# Patient Record
Sex: Male | Born: 1960 | Race: White | Hispanic: No | Marital: Married | Smoking: Never smoker
Health system: Southern US, Community
[De-identification: ages and names within clinical notes are randomized; demographics above are authoritative.]

## PROBLEM LIST (undated history)

## (undated) DIAGNOSIS — E119 Type 2 diabetes mellitus without complications: Secondary | ICD-10-CM

## (undated) DIAGNOSIS — I10 Essential (primary) hypertension: Secondary | ICD-10-CM

## (undated) DIAGNOSIS — E7800 Pure hypercholesterolemia, unspecified: Secondary | ICD-10-CM

---

## 2001-10-07 ENCOUNTER — Emergency Department (HOSPITAL_COMMUNITY): Admission: EM | Admit: 2001-10-07 | Discharge: 2001-10-07 | Payer: Self-pay | Admitting: Emergency Medicine

## 2001-10-12 ENCOUNTER — Emergency Department (HOSPITAL_COMMUNITY): Admission: EM | Admit: 2001-10-12 | Discharge: 2001-10-13 | Payer: Self-pay | Admitting: Emergency Medicine

## 2001-10-13 ENCOUNTER — Encounter: Payer: Self-pay | Admitting: Emergency Medicine

## 2001-10-17 ENCOUNTER — Ambulatory Visit (HOSPITAL_COMMUNITY): Admission: RE | Admit: 2001-10-17 | Discharge: 2001-10-17 | Payer: Self-pay | Admitting: Family Medicine

## 2001-10-17 ENCOUNTER — Encounter: Payer: Self-pay | Admitting: Family Medicine

## 2001-11-14 ENCOUNTER — Ambulatory Visit (HOSPITAL_COMMUNITY): Admission: RE | Admit: 2001-11-14 | Discharge: 2001-11-14 | Payer: Self-pay | Admitting: Internal Medicine

## 2001-11-23 ENCOUNTER — Emergency Department (HOSPITAL_COMMUNITY): Admission: EM | Admit: 2001-11-23 | Discharge: 2001-11-23 | Payer: Self-pay | Admitting: Internal Medicine

## 2002-09-02 ENCOUNTER — Emergency Department (HOSPITAL_COMMUNITY): Admission: EM | Admit: 2002-09-02 | Discharge: 2002-09-02 | Payer: Self-pay | Admitting: Emergency Medicine

## 2007-07-02 ENCOUNTER — Emergency Department (HOSPITAL_COMMUNITY): Admission: EM | Admit: 2007-07-02 | Discharge: 2007-07-02 | Payer: Self-pay | Admitting: Emergency Medicine

## 2007-07-03 ENCOUNTER — Ambulatory Visit: Payer: Self-pay | Admitting: Orthopedic Surgery

## 2007-07-03 DIAGNOSIS — S61209A Unspecified open wound of unspecified finger without damage to nail, initial encounter: Secondary | ICD-10-CM | POA: Insufficient documentation

## 2007-07-04 ENCOUNTER — Encounter: Payer: Self-pay | Admitting: Orthopedic Surgery

## 2007-07-05 ENCOUNTER — Ambulatory Visit: Payer: Self-pay | Admitting: Orthopedic Surgery

## 2007-07-11 ENCOUNTER — Ambulatory Visit: Payer: Self-pay | Admitting: Orthopedic Surgery

## 2007-07-27 ENCOUNTER — Ambulatory Visit: Payer: Self-pay | Admitting: Orthopedic Surgery

## 2010-12-04 NOTE — H&P (Signed)
[facility]  Patient:    THANG, FLETT Visit Number: [id number] MRN: [id number]          Service Type: OUT Location: RAD Attending Physician:  Kirk Ruths Dictated by:   Tana Coast, P.A. Admit Date:  10/17/2001 Discharge Date: 10/17/2001   CC:         John Giovanni, M.D.   History and Physical  DATE OF BIRTH:  [birth year]  CHIEF COMPLAINT:  Sore throat, problem swallowing, acid reflux.  HISTORY OF PRESENT ILLNESS:  The patient is a pleasant 50 year old Caucasian male who we have been seeing at the request of Dr. Karleen Hampshire for evaluation of diarrhea.  He had been on clindamycin after developing a dry socket after removal of a wisdom tooth.  He was treated empirically with Flagyl 500 mg p.o. t.i.d., however, developed numbness and tingling in his hands and feet and was seen in the ER; medications were discontinued at that point.  He was started on Xanax for anxiety.  After being seen by Gardiner Coins, P.A., it was felt that he probably did have antibiotic-related diarrhea.  He was started on vancomycin 250 mg p.o. q.i.d. for seven days.  He called after taking three days worth stating that the pharmacy no longer has tablets and will need to have liquid vancomycin instead.  After taking a couple of doses of this, he complained of sore throat and dry mouth; subsequently, this medication was stopped.  Stool studies were obtained after being on Flagyl for five days.  C. difficile, O&P, stool culture and fecal WBC were all negative; he had a monospot which was negative; this was done primarily for elevated transaminases detected at the same time of this illness.  He has had hepatitis A antibody total, hepatitis B surface antibody and hepatitis C antibody which were all negative.  At last check on October 17, 2001, his SGOT was down to 26 and SGPT down to 66.  The patient presents today stating that his diarrhea has much improved.   He is having two soft bowel movements daily.  He continues to complain of abnormal bloating and gas which did respond to Gas-X previously.  He is still consuming a significant amount of yogurt as well.  Of note, after taking peppermint-flavored Gas-X, he developed a white coating on his tongue. Dr. John Giovanni began Mycelex lozenges for a suspected oral candidiasis. The patient complains of sore throat which has been persisting for at least a couple of weeks.  He complains of hoarseness intermittently over the last couple of years.  He has an eight- to nine-year history of acid reflux and regurgitation.  He recently has developed dysphagia primarily to vegetables over the last couple of weeks; he feels like food is getting stuck in his lower esophagus.  He complains of a very dry mouth and decreased saliva production.  He has had typical heartburn symptoms in his midesophagus region. He has been on PPI therapy for only a few weeks and has taken it intermittently.  He has never had an EGD done previously.  CURRENT MEDICATIONS: 1. Xanax p.r.n. 2. Mycelex lozenges t.i.d. 3. Nexium 40 mg q.d. p.r.n.  ALLERGIES:  PENICILLIN and CODEINE.  He reports throat swelling and throat pain with VANCOMYCIN LIQUID.  CLINDAMYCIN possibly caused C. difficile.  PAST MEDICAL HISTORY: 1. Recently treated for suspected C. difficile colitis, improved with    antibiotic therapy. 2. History of elevated transaminases recently discovered at time of this  illness.  His SGOT has normalized.  His SGPT is coming down nicely.    Hepatitis A, B surface and C antibodies were negative.  He has evidence of    fatty liver and pancreas on recent abdominal ultrasound.  FAMILY HISTORY:  No family history of colorectal cancer, chronic liver disease or GI malignancy.  SOCIAL HISTORY:  He has been married for three years.  He has two stepchildren.  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  He  does not smoke or use alcohol.  PHYSICAL EXAMINATION:  VITAL SIGNS:  Weight 273 pounds, down 2 pounds.  Height 5 foot 8-1/2 inches. Blood pressure 144/90.  Pulse 70.  GENERAL:  A pleasant, somewhat anxious-appearing Caucasian male in no acute distress.  SKIN:  Warm and dry.  No jaundice.  HEENT:  Conjunctivae are pink.  Sclerae nonicteric.  Oropharyngeal mucosa moist and pink.  No lesions, erythema or exudate.  NECK:  No lymphadenopathy or thyromegaly.  CHEST:  Lungs clear to auscultation.  CARDIAC:  Rhythm is regular rate and rhythm.  Normal S1 and S2.  No murmurs, rubs, or gallops.  ABDOMEN:  Positive bowel sounds.  Soft, obese, but symmetrical.  No masses or organomegaly.  He has mild tenderness in the epigastric region with deep palpation.  EXTREMITIES:  No cyanosis or edema.  IMPRESSION: 1. The patient is a pleasant 50 year old gentleman who was recently treated    for suspected Clostridium difficile colitis.  He responded with antibiotic    therapy and is doing reasonably well from that aspect at this time. 2. He has multiple complaints which I suspect are related to acid reflux.  He    complains of eight to nine years of acid reflux, just being on proton pump    inhibitor therapy for the last three weeks.  He has frequent acid    regurgitation, complains of intermittent hoarseness.  He has developed sore    throat for a couple of weeks recently as well as new-onset dysphagia.  He    denies any odynophagia, making Candida esophagitis less likely.  At any    rate, with constellation of symptoms and chronicity of his gastroesophageal    reflux disease symptoms, we need to further evaluate him via    esophagogastroduodenoscopy, specifically looking for any esophageal rings,    webs, strictures, Candida esophagitis, Barretts esophagus or even peptic    ulcer disease.  The patient is agreeable to proceed. 3. History of elevated liver function tests, I suspect related to  fatty liver.     He is due for a followup of liver function tests today.  PLAN: 1. EGD with plus-or-minus dilatation in the near future. 2. Recheck LFTs. 3. Will give a trial of antispasmodic, Bentyl 10 mg p.o. t.i.d., #90 with one    refill given. 4. He can continue Gas-X p.r.n., however, should avoid peppermint flavored. 5. Nexium 40 mg p.o. q.d. -- four weeks of samples given. 6. I discussed risks, alternatives and benefits with the patient regarding EGD    with dilatation and he is agreeable to proceed. Dictated by:   Tana Coast, P.A. Attending Physician:  Kirk Ruths DD:  11/07/01 TD:  11/07/01 Job: [id number]

## 2014-12-18 ENCOUNTER — Other Ambulatory Visit (HOSPITAL_COMMUNITY): Payer: Self-pay | Admitting: Family Medicine

## 2014-12-18 DIAGNOSIS — R1011 Right upper quadrant pain: Secondary | ICD-10-CM

## 2014-12-30 ENCOUNTER — Ambulatory Visit (HOSPITAL_COMMUNITY)
Admission: RE | Admit: 2014-12-30 | Discharge: 2014-12-30 | Disposition: A | Discharge status: Home / Self Care | Payer: BLUE CROSS/BLUE SHIELD | Source: Ambulatory Visit | Attending: Family Medicine | Admitting: Family Medicine

## 2014-12-30 DIAGNOSIS — R109 Unspecified abdominal pain: Secondary | ICD-10-CM | POA: Diagnosis not present

## 2014-12-30 DIAGNOSIS — R932 Abnormal findings on diagnostic imaging of liver and biliary tract: Secondary | ICD-10-CM | POA: Diagnosis not present

## 2014-12-30 DIAGNOSIS — R1011 Right upper quadrant pain: Secondary | ICD-10-CM

## 2015-01-09 ENCOUNTER — Ambulatory Visit (HOSPITAL_COMMUNITY): Admission: RE | Admit: 2015-01-09 | Payer: BLUE CROSS/BLUE SHIELD | Source: Ambulatory Visit

## 2015-01-09 ENCOUNTER — Ambulatory Visit (HOSPITAL_COMMUNITY)
Admission: RE | Admit: 2015-01-09 | Discharge: 2015-01-09 | Disposition: A | Discharge status: Home / Self Care | Payer: BLUE CROSS/BLUE SHIELD | Source: Ambulatory Visit | Attending: Family Medicine | Admitting: Family Medicine

## 2015-01-09 ENCOUNTER — Other Ambulatory Visit (HOSPITAL_COMMUNITY): Payer: Self-pay | Admitting: Family Medicine

## 2015-01-09 DIAGNOSIS — M79661 Pain in right lower leg: Principal | ICD-10-CM

## 2015-01-09 DIAGNOSIS — M25561 Pain in right knee: Secondary | ICD-10-CM

## 2015-01-10 ENCOUNTER — Other Ambulatory Visit (HOSPITAL_COMMUNITY): Payer: Self-pay | Admitting: Family Medicine

## 2015-01-10 ENCOUNTER — Ambulatory Visit (HOSPITAL_COMMUNITY)
Admission: RE | Admit: 2015-01-10 | Discharge: 2015-01-10 | Disposition: A | Discharge status: Home / Self Care | Payer: BLUE CROSS/BLUE SHIELD | Source: Ambulatory Visit | Attending: Family Medicine | Admitting: Family Medicine

## 2015-01-10 DIAGNOSIS — M79661 Pain in right lower leg: Secondary | ICD-10-CM | POA: Diagnosis not present

## 2015-01-10 DIAGNOSIS — M25561 Pain in right knee: Secondary | ICD-10-CM

## 2015-01-20 ENCOUNTER — Emergency Department (HOSPITAL_COMMUNITY): Payer: BLUE CROSS/BLUE SHIELD

## 2015-01-20 ENCOUNTER — Encounter (HOSPITAL_COMMUNITY): Payer: Self-pay | Admitting: *Deleted

## 2015-01-20 ENCOUNTER — Emergency Department (HOSPITAL_COMMUNITY)
Admission: EM | Admit: 2015-01-20 | Discharge: 2015-01-20 | Disposition: A | Discharge status: Home / Self Care | Payer: BLUE CROSS/BLUE SHIELD | Attending: Emergency Medicine | Admitting: Emergency Medicine

## 2015-01-20 DIAGNOSIS — Z880 Allergy status to penicillin: Secondary | ICD-10-CM | POA: Diagnosis not present

## 2015-01-20 DIAGNOSIS — R109 Unspecified abdominal pain: Secondary | ICD-10-CM | POA: Diagnosis present

## 2015-01-20 DIAGNOSIS — E119 Type 2 diabetes mellitus without complications: Secondary | ICD-10-CM | POA: Diagnosis not present

## 2015-01-20 DIAGNOSIS — Z794 Long term (current) use of insulin: Secondary | ICD-10-CM | POA: Insufficient documentation

## 2015-01-20 DIAGNOSIS — N23 Unspecified renal colic: Secondary | ICD-10-CM | POA: Diagnosis not present

## 2015-01-20 DIAGNOSIS — Z79899 Other long term (current) drug therapy: Secondary | ICD-10-CM | POA: Diagnosis not present

## 2015-01-20 DIAGNOSIS — I10 Essential (primary) hypertension: Secondary | ICD-10-CM | POA: Diagnosis not present

## 2015-01-20 DIAGNOSIS — E780 Pure hypercholesterolemia: Secondary | ICD-10-CM | POA: Insufficient documentation

## 2015-01-20 HISTORY — DX: Type 2 diabetes mellitus without complications: E11.9

## 2015-01-20 HISTORY — DX: Pure hypercholesterolemia, unspecified: E78.00

## 2015-01-20 HISTORY — DX: Essential (primary) hypertension: I10

## 2015-01-20 LAB — URINALYSIS, ROUTINE W REFLEX MICROSCOPIC
Bilirubin Urine: NEGATIVE
Glucose, UA: 500 mg/dL — AB
Hgb urine dipstick: NEGATIVE
KETONES UR: NEGATIVE mg/dL
LEUKOCYTES UA: NEGATIVE
NITRITE: NEGATIVE
PH: 6 (ref 5.0–8.0)
Protein, ur: NEGATIVE mg/dL
Specific Gravity, Urine: 1.02 (ref 1.005–1.030)
UROBILINOGEN UA: 0.2 mg/dL (ref 0.0–1.0)

## 2015-01-20 MED ORDER — KETOROLAC TROMETHAMINE 60 MG/2ML IM SOLN
30.0000 mg | Freq: Once | INTRAMUSCULAR | Status: AC
  Administered 2015-01-20: 30 mg via INTRAMUSCULAR
  Filled 2015-01-20: qty 2

## 2015-01-20 MED ORDER — OXYCODONE-ACETAMINOPHEN 5-325 MG PO TABS: ORAL_TABLET | ORAL | Status: DC

## 2015-01-20 MED ORDER — ONDANSETRON HCL 4 MG PO TABS
4.0000 mg | ORAL_TABLET | Freq: Three times a day (TID) | ORAL | Status: DC | PRN
Start: 2015-01-20 — End: 2015-05-26

## 2015-01-20 MED ORDER — MORPHINE SULFATE 4 MG/ML IJ SOLN
4.0000 mg | Freq: Once | INTRAMUSCULAR | Status: AC
  Administered 2015-01-20: 4 mg via INTRAMUSCULAR
  Filled 2015-01-20: qty 1

## 2015-01-20 MED ORDER — ONDANSETRON 8 MG PO TBDP
8.0000 mg | ORAL_TABLET | Freq: Once | ORAL | Status: AC
  Administered 2015-01-20: 8 mg via ORAL
  Filled 2015-01-20: qty 1

## 2015-01-20 NOTE — ED Notes (Signed)
Dr. Clarene DukeMcManus in with pt at this time

## 2015-01-20 NOTE — ED Provider Notes (Signed)
CSN: [id number]     Arrival date & time 01/20/15  2103 History   First MD Initiated Contact with Patient 01/20/15 2113     Chief Complaint  Patient presents with  . Flank Pain     HPI  Pt was seen at 2125. Per pt and his spouse, c/o gradual onset and persistence of constant left sided low back "pain" for the past several hours. Describes the pain as "dull." Pt took tylenol a few hours ago without improvement. Has been associated with nausea. Denies incont/retention of bowel or bladder, no saddle anesthesia, no focal motor weakness, no tingling/numbness in extremities, no fevers, no injury, no abd pain, no vomiting/diarrhea, no CP/SOB.    Past Medical History  Diagnosis Date  . Diabetes mellitus without complication   . Hypertension   . High cholesterol    History reviewed. No pertinent past surgical history.  History  Substance Use Topics  . Smoking status: Never Smoker   . Smokeless tobacco: Not on file  . Alcohol Use: No    Review of Systems ROS: Statement: All systems negative except as marked or noted in the HPI; Constitutional: Negative for fever and chills. ; ; Eyes: Negative for eye pain, redness and discharge. ; ; ENMT: Negative for ear pain, hoarseness, nasal congestion, sinus pressure and sore throat. ; ; Cardiovascular: Negative for chest pain, palpitations, diaphoresis, dyspnea and peripheral edema. ; ; Respiratory: Negative for cough, wheezing and stridor. ; ; Gastrointestinal: +nausea. Negative for vomiting, diarrhea, abdominal pain, blood in stool, hematemesis, jaundice and rectal bleeding. . ; ; Genitourinary: Negative for dysuria, flank pain and hematuria. ; ; Genital:  No penile drainage or rash, no testicular pain or swelling, no scrotal rash or swelling. ;; Musculoskeletal: +LBP. Negative for neck pain. Negative for swelling and trauma.; ; Skin: Negative for pruritus, rash, abrasions, blisters, bruising and skin lesion.; ; Neuro: Negative for headache, lightheadedness  and neck stiffness. Negative for weakness, altered level of consciousness , altered mental status, extremity weakness, paresthesias, involuntary movement, seizure and syncope.      Allergies  Penicillins  Home Medications   Prior to Admission medications   Medication Sig Start Date End Date Taking? Authorizing Provider  acetaminophen (TYLENOL) 500 MG tablet Take 1,000 mg by mouth every 6 (six) hours as needed for mild pain.   Yes Historical Provider, MD  calcium carbonate (TUMS - DOSED IN MG ELEMENTAL CALCIUM) 500 MG chewable tablet Chew 2 tablets by mouth 2 (two) times daily with a meal.   Yes Historical Provider, MD  gabapentin (NEURONTIN) 300 MG capsule Take 300 mg by mouth 3 (three) times daily.   Yes Historical Provider, MD  insulin glargine (LANTUS) 100 UNIT/ML injection Inject 80 Units into the skin at bedtime.   Yes Historical Provider, MD  LISINOPRIL PO Take 1 tablet by mouth daily.   Yes Historical Provider, MD  LOVASTATIN PO Take 1 tablet by mouth daily.   Yes Historical Provider, MD  meloxicam (MOBIC) 15 MG tablet Take 15 mg by mouth at bedtime as needed for pain.   Yes Historical Provider, MD  metFORMIN (GLUCOPHAGE) 500 MG tablet Take 500 mg by mouth 2 (two) times daily with a meal.   Yes Historical Provider, MD  traMADol (ULTRAM) 50 MG tablet Take 50 mg by mouth every 6 (six) hours as needed for moderate pain.   Yes Historical Provider, MD   BP 199/97 mmHg  Pulse 111  Temp(Src) 98.5 F (36.9 C) (Oral)  Resp 22  Ht 5\' 8"  (1.727 m)  Wt 266 lb (120.657 kg)  BMI 40.45 kg/m2  SpO2 100% Physical Exam  2130; Physical examination:  Nursing notes reviewed; Vital signs and O2 SAT reviewed;  Constitutional: Well developed, Well nourished, Well hydrated, Uncomfortable appearing; Head:  Normocephalic, atraumatic; Eyes: EOMI, PERRL, No scleral icterus; ENMT: Mouth and pharynx normal, Mucous membranes moist; Neck: Supple, Full range of motion, No lymphadenopathy; Cardiovascular:  Tachycardic rate and rhythm, No gallop; Respiratory: Breath sounds clear & equal bilaterally, No wheezes.  Speaking full sentences with ease, Normal respiratory effort/excursion; Chest: Nontender, Movement normal; Abdomen: Soft, Nontender, Nondistended, Normal bowel sounds; Genitourinary: No CVA tenderness; Spine:  No midline CS, TS, LS tenderness. +TTP left lumbar paraspinal muscles. No rash.;; Extremities: Pulses normal, No tenderness, No edema, No calf edema or asymmetry.; Neuro: AA&Ox3, Major CN grossly intact.  Speech clear. No gross focal motor or sensory deficits in extremities.; Skin: Color normal, Warm, Dry.   ED Course  Procedures     EKG Interpretation None      MDM  MDM Reviewed: previous chart, nursing note and vitals Reviewed previous: labs Interpretation: labs and CT scan      Results for orders placed or performed during the hospital encounter of 01/20/15  Urinalysis, Routine w reflex microscopic (not at [facility])  Result Value Ref Range   Color, Urine YELLOW YELLOW   APPearance CLEAR CLEAR   Specific Gravity, Urine 1.020 1.005 - 1.030   pH 6.0 5.0 - 8.0   Glucose, UA 500 (A) NEGATIVE mg/dL   Hgb urine dipstick NEGATIVE NEGATIVE   Bilirubin Urine NEGATIVE NEGATIVE   Ketones, ur NEGATIVE NEGATIVE mg/dL   Protein, ur NEGATIVE NEGATIVE mg/dL   Urobilinogen, UA 0.2 0.0 - 1.0 mg/dL   Nitrite NEGATIVE NEGATIVE   Leukocytes, UA NEGATIVE NEGATIVE   Ct Renal Stone Study 01/20/2015   CLINICAL DATA:  LEFT flank pain beginning yesterday. History of diabetes and hypertension, no history of kidney stones.  EXAM: CT ABDOMEN AND PELVIS WITHOUT CONTRAST  TECHNIQUE: Multidetector CT imaging of the abdomen and pelvis was performed following the standard protocol without IV contrast.  COMPARISON:  Abdominal ultrasound December 30, 2014  FINDINGS: LUNG BASES: 3 mm RIGHT middle lobe sub solid pulmonary nodule, below size followup recommendations. Included heart size is normal. No pericardial  effusions. Small paraesophageal lymph nodes.  KIDNEYS/BLADDER: Kidneys are orthotopic, demonstrating normal size and morphology. No nephrolithiasis ; limited assessment for renal masses on this nonenhanced examination. Very mild LEFT hydroureteronephrosis, mild LEFT perinephric fat stranding. Urinary bladder is partially distended and unremarkable.  SOLID ORGANS: The liver, spleen, pancreas and adrenal glands are unremarkable for this non-contrast examination. Subcentimeter gallstone without CT findings of acute cholecystitis.  GASTROINTESTINAL TRACT: The stomach, small and large bowel are normal in course and caliber without inflammatory changes, the sensitivity may be decreased by lack of enteric contrast. Mild amount of retained large bowel stool. Mild descending colon and sigmoid diverticulosis. Normal appendix.  PERITONEUM/RETROPERITONEUM: Aortoiliac vessels are normal in course and caliber. No lymphadenopathy by CT size criteria. Small cavoatrial an mesenteric lymph nodes are likely reactive. Prostate is not enlarged. No intraperitoneal free fluid nor free air.  SOFT TISSUES/ OSSEOUS STRUCTURES: Nonsuspicious. Linear RIGHT gluteal subcutaneous gas. Moderate degenerative change of the included thoracic spine. Scattered Schmorl's nodes. Mild degenerative change of the LEFT hip.  IMPRESSION: Very mild LEFT hydroureteronephrosis and perinephric fat stranding without urolithiasis likely representing recently passed stone, less likely urinary tract infection. Recommend correlation with urinary analysis.  Mild diverticulosis without acute diverticulitis.  RIGHT gluteal subcutaneous gas, recommend correlation with recent injection.   Electronically Signed   By: Awilda Metro M.D.   On: 01/20/2015 22:37    2315:  Pain improving after meds. No N/V while in the ED. No UTI on Udip. CT scan with likely recently passed left ureteral stone. Pt is ready to go home now. Dx and testing d/w pt and family.  Questions  answered.  Verb understanding, agreeable to d/c home with outpt f/u.   Samuel Jester, DO 01/22/15 1129

## 2015-01-20 NOTE — Discharge Instructions (Signed)
°Emergency Department Resource Guide °1) Find a Doctor and Pay Out of Pocket °Although you won't have to find out who is covered by your insurance plan, it is a good idea to ask around and get recommendations. You will then need to call the office and see if the doctor you have chosen will accept you as a new patient and what types of options they offer for patients who are self-pay. Some doctors offer discounts or will set up payment plans for their patients who do not have insurance, but you will need to ask so you aren't surprised when you get to your appointment. ° °2) Contact Your Local Health Department °Not all health departments have doctors that can see patients for sick visits, but many do, so it is worth a call to see if yours does. If you don't know where your local health department is, you can check in your phone book. The CDC also has a tool to help you locate your state's health department, and many state websites also have listings of all of their local health departments. ° °3) Find a Walk-in Clinic °If your illness is not likely to be very severe or complicated, you may want to try a walk in clinic. These are popping up all over the country in pharmacies, drugstores, and shopping centers. They're usually staffed by nurse practitioners or physician assistants that have been trained to treat common illnesses and complaints. They're usually fairly quick and inexpensive. However, if you have serious medical issues or chronic medical problems, these are probably not your best option. ° °No Primary Care Doctor: °- Call [facility] at  832-8000 - they can help you locate a primary care doctor that  accepts your insurance, provides certain services, etc. °- Physician Referral Service- 1-800-533-3463 ° °Chronic Pain Problems: °Organization         Address  Phone   Notes  °[facility]  (336) 297-2271 Patients need to be referred by their primary care doctor.  ° °Medication  Assistance: °Organization         Address  Phone   Notes  °[place] [facility] [address] °[place], [place] [address] (336) 641-8030 --Must be a resident of [place] °-- Must have NO insurance coverage whatsoever (no Medicaid/ Medicare, etc.) °-- The pt. MUST have a primary care doctor that directs their care regularly and follows them in the community °  °[facility]  (866) 331-1348   °[employer]  (888) 892-1162   ° °Agencies that provide inexpensive medical care: °Organization         Address  Phone   Notes  °[facility]  (336) 832-8035   °[facility]    (336) 832-7272   °[facility] [address] °[place], [place] [address] (336) 832-4777   °[facility] [address], °[place] (336) 271-4999   °[facility]    (336) 373-0678   °[facility]    (336) 272-1050   °[facility] ° [address], [place] Phone:  (336) 832-4444, Fax:  (336) 832-4440 Hours of Operation:  9 am - 6 pm, M-F.  Also accepts Medicaid/Medicare and self-pay.  °[facility] ° [address], [place] Phone: (336) 832-3150, Fax: (336) [phone]. Hours of Operation:  8:30 am - 5:30 pm, M-F.  Also accepts Medicaid and self-pay.  °[facility] [place] [address], [place] Phone: (336) 878-6027   °[facility] [address], [place], [place] (336)723-1848, Ext. 123 Mondays & Thursdays: 7-9 AM.  First 15 patients are seen on a first come, first serve basis. °  ° °Medicaid-accepting [place] Providers: ° °Organization         Address  Phone   Notes  °[facility] [address], [place] (336) 641-2100 Also accepts self-pay patients.  °[facility] [address], [place] ° (336) 856-9996   °[facility] [address], [place]  (336) 288-8857   °[facility] [address] [place] [address], [place] (336) 299-7000   °Veita Bland [address], [place]  ° (336) 373-1557 Only accepts [place] Access Medicaid patients after they have their name applied to their card.  ° °Self-Pay (no insurance) in [place]: ° °Organization         Address  Phone   Notes  °Sickle Cell Patients, [facility] [address], [place] (336) 832-1970   °[facility] [address], [place] (336) 832-4400   °[facility] ° [address], [place] (336) 992-4800   °[facility]/Dr. Osei-Bonsu ° [address] [place] [address], [place] or [address], [place] (336) 841-8500 Phone number for both [place] and [place] locations is the same.  °[facility] [address], [place] (336) 299-0000   °[facility] [address] [place] [address], [place] or [address] (336) 852-7530 °(336) 878-2260   °[facility] [address], [place] (336) 350-1642, phone; (336) 294-5005, fax Sees patients 1st and 3rd Saturday of every month.  Must not qualify for public or private insurance (i.e. Medicaid, Medicare, [place] Health Choice, Veterans' Benefits) • Household income should be no more than 200% of the poverty level •The clinic cannot treat you if you are pregnant or think you are pregnant • Sexually transmitted diseases are not treated at the clinic.  ° ° °Dental Care: °Organization         Address  Phone  Notes  °[place] [facility] [address], [place] (336) 641-6152 Accepts children up to age 21 who are enrolled in Medicaid or [place] Health Choice; pregnant women with a Medicaid card; and children who have applied for Medicaid or [place] Health Choice, but were declined, whose parents can pay a reduced fee at time of service.  °[place]  [facility] [place]  [address], [place] (336) 641-7733 Accepts children up to age 21 who are enrolled in Medicaid or [place] Health Choice; pregnant women with a Medicaid card; and children who have applied for Medicaid or [place] Health Choice, but were declined, whose parents can pay a reduced fee at time of service.  °[facility] ° [address], [place] (336) 641-4533 Patients are seen by appointment only. Walk-ins are not accepted. [facility] will see patients 18 years of age and older. °Monday - Tuesday (8am-5pm) °Most Wednesdays (8:30-5pm) °$30 per visit, cash only  °[facility] ° [address], [place] (336) 641-4533 Patients are seen by appointment only. Walk-ins are not accepted. [facility] will see patients 18 years of age and older. °One   Wednesday Evening (Monthly: Volunteer Based).  $30 per visit, cash only  °[facility]  (919) 537-3737 for adults; Children under age 4, call Graduate Pediatric Dentistry at ([phone]) [phone]. Children aged 4-14, please call (919) [phone] to request a pediatric application. ° Dental services are provided in all areas of dental care including fillings, crowns and bridges, complete and partial dentures, implants, gum treatment, root canals, and extractions. Preventive care is also provided. Treatment is provided to both adults and children. °Patients are selected via a lottery and there is often a waiting list. °  °[facility] [address], °[place] ° (336) 763-8833 [email or web address] °  °[facility] [address], [place], [place] (336)723-1848, Ext. 123 Second and Fourth Thursday of each month, opens at 6:30 AM; Clinic ends at 9 AM.  Patients are seen on a first-come first-served basis, and a limited number are seen during each clinic.  ° °[facility] ° [address], [place], [place] (336) 723-7904    Eligibility Requirements °You must have lived in [place], [place], or [place] counties for at least the last three months. °  You cannot be eligible for state or federal sponsored healthcare insurance, including Veterans Administration, Medicaid, or Medicare. °  You generally cannot be eligible for healthcare insurance through your employer.  °  How to apply: °Eligibility screenings are held every Tuesday and Wednesday afternoon from 1:00 pm until 4:00 pm. You do not need an appointment for the interview!  °[facility] [address], [place], [place] 336-631-2330   °[facility]  336-342-8273   °[facility]  336-703-3100   °[facility]  336-570-6415   ° °Behavioral Health Resources in the Community: °Intensive Outpatient Programs °Organization         Address  Phone  Notes  °[facility] [address], [place], [place] 336-878-6098   °[facility] [address], [place], [place] 336-832-9800   °[facility] [address], [place], [place] ° 336-882-2125   °[facility] [address],  °[place], [place] 1-800-853-5163 or 336-641-4981   °Substance Abuse Resources °Organization         Address  Phone  Notes  °[facility]  336-882-2125   °[facility]  336-784-9470   °[facility]  336-285-9073   °[facility]  336-845-3988   °Residential & Outpatient Substance Abuse Program  1-800-659-3381   °Psychological Services °Organization         Address  Phone  Notes  °[facility]  336- 832-9600   °[facility]  336- 378-7881   °[facility] [address], [place] 1-800-853-5163 or 336-641-4981   ° °Mobile Crisis Teams °Organization         Address  Phone  Notes  °[facility], Mobile Crisis Care Unit  1-877-626-1772   °Assertive °[facility] ° [address]  [place], [place] 336-834-9664   °Sharon DeEsch [address] °[place] 336-554-5454   ° °Self-Help/Support Groups °Organization         Address  Phone             Notes  °[facility]  - variety of support groups  336- 373-1402 Call for more information  °Narcotics Anonymous (NA), [facility] [address], °[place]  2 meetings at this location  ° °  Residential Treatment Programs Organization         Address  Phone  Notes  [facility] [address],    [place]  [phone]   [facility]  [address], [place] [phone], [place], [place] [phone]([phone]) [phone]   [facility] [address], [place] [phone] Admissions: 8am-3pm M-F  [facility] [address]    [place], [place] [phone]   [facility] [address], [place], [place] [phone]   [facility] [address]  [place], [place] [phone]   [facility] Intensive Outpatient [address], [place], [place] [phone]   [facility] [address]  [place], [place] [phone] or [phone]   [facility] [address] [place], [place] [phone] Accepts Medicaid  [facility] [address]  [place] [phone] Substance Abuse/Addiction Treatment   [facility] Organization         Address  Phone  Notes  [facility]  409-729-9669(888) [phone]   Angie FavaJulie Brannon, PhD [address] [place], [place]   [phone](336) [phone] or [phone](336) [phone]   [facility]   [address] [place], [place] [phone](336) [phone]   [facility] [address], [place], [place] [phone](336) [phone] Insurance/Medicaid/sponsorship through [facility] [address]                                    [place], [place] [phone](336) [phone] Therapy/tele-psych/case    [facility] [address]   [place], [place] [phone](336) [phone]    Dr. Lolly MustacheArfeen  [phone](336) [phone]   [facility]  [employer] [facility] 1) [address], [place] 2) [address], [place] 3)  [address], [place] [phone](336) [phone](336) [phone]  [phone](336) [phone]   [facility] Child Abuse Hotline 740-031-4920(336) [phone] or [phone](336) [phone] (After Hours)      Take the prescriptions as directed.  Call your regular medical doctor tomorrow to schedule a follow up appointment within the next 2 to 3 days.  Return to the Emergency Department immediately sooner if worsening.

## 2015-01-20 NOTE — ED Notes (Addendum)
Pt c/o dull pain to his left flank area. Pt also c/o nausea and dizziness. Pt states he just ate a big meal at a cookout and the pain got worse.

## 2015-05-26 ENCOUNTER — Encounter (HOSPITAL_COMMUNITY): Payer: Self-pay | Admitting: Emergency Medicine

## 2015-05-26 ENCOUNTER — Emergency Department (HOSPITAL_COMMUNITY)
Admission: EM | Admit: 2015-05-26 | Discharge: 2015-05-27 | Disposition: A | Discharge status: Home / Self Care | Payer: BLUE CROSS/BLUE SHIELD | Attending: Emergency Medicine | Admitting: Emergency Medicine

## 2015-05-26 DIAGNOSIS — I10 Essential (primary) hypertension: Secondary | ICD-10-CM | POA: Diagnosis present

## 2015-05-26 DIAGNOSIS — Z7982 Long term (current) use of aspirin: Secondary | ICD-10-CM | POA: Diagnosis not present

## 2015-05-26 DIAGNOSIS — Z794 Long term (current) use of insulin: Secondary | ICD-10-CM | POA: Insufficient documentation

## 2015-05-26 DIAGNOSIS — E119 Type 2 diabetes mellitus without complications: Secondary | ICD-10-CM | POA: Insufficient documentation

## 2015-05-26 DIAGNOSIS — Z880 Allergy status to penicillin: Secondary | ICD-10-CM | POA: Insufficient documentation

## 2015-05-26 DIAGNOSIS — R000 Tachycardia, unspecified: Secondary | ICD-10-CM | POA: Insufficient documentation

## 2015-05-26 DIAGNOSIS — Z79899 Other long term (current) drug therapy: Secondary | ICD-10-CM | POA: Insufficient documentation

## 2015-05-26 DIAGNOSIS — E7800 Pure hypercholesterolemia, unspecified: Secondary | ICD-10-CM | POA: Diagnosis not present

## 2015-05-26 LAB — BASIC METABOLIC PANEL
Anion gap: 9 (ref 5–15)
BUN: 19 mg/dL (ref 6–20)
CO2: 27 mmol/L (ref 22–32)
Calcium: 9.5 mg/dL (ref 8.9–10.3)
Chloride: 104 mmol/L (ref 101–111)
Creatinine, Ser: 1.14 mg/dL (ref 0.61–1.24)
GFR calc Af Amer: >60 mL/min (ref >60)
GLUCOSE: 195 mg/dL — AB (ref 65–99)
POTASSIUM: 4.4 mmol/L (ref 3.5–5.1)
Sodium: 140 mmol/L (ref 135–145)

## 2015-05-26 NOTE — ED Notes (Signed)
Patient states he went to his doctor today and his B/P was 180/110. Was told to take blood pressure medication when he got home. States he took his blood pressure medication and 0.25 xanax. States two hours later blood pressure was still elevated.

## 2015-05-26 NOTE — ED Provider Notes (Signed)
CSN: [id number]     Arrival date & time 05/26/15  2107 History   First MD Initiated Contact with Patient 05/26/15 2129     Chief Complaint  Patient presents with  . Hypertension     (Consider location/radiation/quality/duration/timing/severity/associated sxs/prior Treatment) The history is provided by the patient.   ROBERTS BON is a 54 y.o. male with history of DM and hypertension presenting for evaluation of elevated blood pressure today.  He was seen by his pcp today and had a bp of 180/110.  He was told to take an extra lisinopril (usually takes 20 mg qhs).  He took the first tablet when he arrived home with plans to take the second one before bed and also took xanax tablet, but when he took his bp 2 hours later, his bp was still as elevated.  He denies headache, vision changes, chest pain, sob or peripheral edema.  He has recently had nasal congestion and took ibuprofen all weekend along with alka seltzer Plus Cold Formula (although took the last dose of this 3 days ago).  He called his pcp who advised to get checked here tonight and he would followup in the morning to address his medications.    Past Medical History  Diagnosis Date  . Diabetes mellitus without complication (HCC)   . Hypertension   . High cholesterol    History reviewed. No pertinent past surgical history. History reviewed. No pertinent family history. Social History  Substance Use Topics  . Smoking status: Never Smoker   . Smokeless tobacco: None  . Alcohol Use: No    Review of Systems  Constitutional: Negative for fever.  HENT: Negative for congestion and sore throat.   Eyes: Negative.  Negative for visual disturbance.  Respiratory: Negative for chest tightness, shortness of breath and wheezing.   Cardiovascular: Negative for chest pain, palpitations and leg swelling.  Gastrointestinal: Negative for nausea and abdominal pain.  Genitourinary: Negative.   Musculoskeletal: Negative for joint swelling,  arthralgias and neck pain.  Skin: Negative.  Negative for rash and wound.  Neurological: Negative for dizziness, weakness, light-headedness, numbness and headaches.  Psychiatric/Behavioral: Negative.       Allergies  Penicillins  Home Medications   Prior to Admission medications   Medication Sig Start Date End Date Taking? Authorizing Provider  acetaminophen (TYLENOL) 500 MG tablet Take 1,000-1,500 mg by mouth 2 (two) times daily as needed for mild pain or moderate pain.    Yes Historical Provider, MD  ALPRAZolam Prudy Feeler) 0.25 MG tablet Take 1 tablet by mouth 3 (three) times daily as needed for anxiety or sleep.  03/27/15  Yes Historical Provider, MD  aspirin EC 81 MG tablet Take 81 mg by mouth daily.   Yes Historical Provider, MD  calcium carbonate (TUMS - DOSED IN MG ELEMENTAL CALCIUM) 500 MG chewable tablet Chew 2 tablets by mouth 2 (two) times daily with a meal.   Yes Historical Provider, MD  docusate sodium (COLACE) 100 MG capsule Take 100 mg by mouth daily as needed for mild constipation.   Yes Historical Provider, MD  gabapentin (NEURONTIN) 300 MG capsule Take 300 mg by mouth daily as needed (for pain).    Yes Historical Provider, MD  LANTUS SOLOSTAR 100 UNIT/ML Solostar Pen Take 70 Units by mouth every morning. 05/22/15  Yes Historical Provider, MD  lisinopril (PRINIVIL,ZESTRIL) 20 MG tablet Take 20 mg by mouth daily. 05/22/15  Yes Historical Provider, MD  lovastatin (MEVACOR) 40 MG tablet Take 40 mg by mouth daily.  05/22/15  Yes Historical Provider, MD  metFORMIN (GLUCOPHAGE-XR) 500 MG 24 hr tablet Take 500 mg by mouth 2 (two) times daily. 05/07/15  Yes Historical Provider, MD  Probiotic Product (ACIDOPHILUS PEARLS) CAPS Take 1 capsule by mouth daily.   Yes Historical Provider, MD  traMADol (ULTRAM) 50 MG tablet Take 50 mg by mouth every 6 (six) hours as needed for moderate pain.   Yes Historical Provider, MD   BP 166/93 mmHg  Pulse 113  Temp(Src) 98 F (36.7 C) (Oral)  Resp 12   Ht 5' 8.5" (1.74 m)  Wt 260 lb (117.935 kg)  BMI 38.95 kg/m2  SpO2 99% Physical Exam  Constitutional: He appears well-developed and well-nourished.  HENT:  Head: Normocephalic and atraumatic.  Eyes: Conjunctivae are normal.  Neck: Normal range of motion.  Cardiovascular: Normal rate, regular rhythm, normal heart sounds and intact distal pulses.   Pulmonary/Chest: Effort normal and breath sounds normal. He has no wheezes.  Abdominal: Soft. Bowel sounds are normal. There is no tenderness.  Musculoskeletal: Normal range of motion. He exhibits no edema or tenderness.  Neurological: He is alert.  Skin: Skin is warm and dry.  Psychiatric: He has a normal mood and affect.  Nursing note and vitals reviewed.   ED Course  Procedures (including critical care time) Labs Review Labs Reviewed  BASIC METABOLIC PANEL - Abnormal; Notable for the following:    Glucose, Bld 195 (*)    All other components within normal limits    Imaging Review No results found. I have personally reviewed and evaluated these images and lab results as part of my medical decision-making.   EKG Interpretation   Date/Time:  Monday May 26 2015 21:30:46 EST Ventricular Rate:  111 PR Interval:  138 QRS Duration: 89 QT Interval:  323 QTC Calculation: 439 R Axis:   -6 Text Interpretation:  Sinus tachycardia No previous ECGs available  Confirmed by ZACKOWSKI  MD, SCOTT [phone]([id number]) on 05/26/2015 10:04:15 PM      MDM   Final diagnoses:  Essential hypertension  Tachycardia    Prior visit from July reviewed, tachycardic during that visit as well.  No cp, no sob, normal saturations, no clinical findings or sx to suggest PE.  Pt blames tachycardia on anxiety of being in the ed.  Recheck of bp 166/93.  Pt states this is much closer to his normal bp's.  No sx at time of dc.  Advised f/u with his pcp tomorrow which is already planned.  Pt also advised to hold ibuprofen and Alka Seltzer cold formula as these will  elevate bp.  Coricidin products recommended.    Burgess AmorJulie Laurali Goddard, PA-C 05/26/15 2312  Vanetta MuldersScott Zackowski, MD 05/26/15 [phone]

## 2015-05-26 NOTE — Discharge Instructions (Signed)
Hypertension Hypertension, commonly called high blood pressure, is when the force of blood pumping through your arteries is too strong. Your arteries are the blood vessels that carry blood from your heart throughout your body. A blood pressure reading consists of a higher number over a lower number, such as 110/72. The higher number (systolic) is the pressure inside your arteries when your heart pumps. The lower number (diastolic) is the pressure inside your arteries when your heart relaxes. Ideally you want your blood pressure below 120/80. Hypertension forces your heart to work harder to pump blood. Your arteries may become narrow or stiff. Having untreated or uncontrolled hypertension can cause heart attack, stroke, kidney disease, and other problems. RISK FACTORS Some risk factors for high blood pressure are controllable. Others are not.  Risk factors you cannot control include:   Race. You may be at higher risk if you are African American.  Age. Risk increases with age.  Gender. Men are at higher risk than women before age 45 years. After age 65, women are at higher risk than men. Risk factors you can control include:  Not getting enough exercise or physical activity.  Being overweight.  Getting too much fat, sugar, calories, or salt in your diet.  Drinking too much alcohol. SIGNS AND SYMPTOMS Hypertension does not usually cause signs or symptoms. Extremely high blood pressure (hypertensive crisis) may cause headache, anxiety, shortness of breath, and nosebleed. DIAGNOSIS To check if you have hypertension, your health care provider will measure your blood pressure while you are seated, with your arm held at the level of your heart. It should be measured at least twice using the same arm. Certain conditions can cause a difference in blood pressure between your right and left arms. A blood pressure reading that is higher than normal on one occasion does not mean that you need treatment. If  it is not clear whether you have high blood pressure, you may be asked to return on a different day to have your blood pressure checked again. Or, you may be asked to monitor your blood pressure at home for 1 or more weeks. TREATMENT Treating high blood pressure includes making lifestyle changes and possibly taking medicine. Living a healthy lifestyle can help lower high blood pressure. You may need to change some of your habits. Lifestyle changes may include:  Following the DASH diet. This diet is high in fruits, vegetables, and whole grains. It is low in salt, red meat, and added sugars.  Keep your sodium intake below 2,300 mg per day.  Getting at least 30-45 minutes of aerobic exercise at least 4 times per week.  Losing weight if necessary.  Not smoking.  Limiting alcoholic beverages.  Learning ways to reduce stress. Your health care provider may prescribe medicine if lifestyle changes are not enough to get your blood pressure under control, and if one of the following is true:  You are 18-59 years of age and your systolic blood pressure is above 140.  You are 60 years of age or older, and your systolic blood pressure is above 150.  Your diastolic blood pressure is above 90.  You have diabetes, and your systolic blood pressure is over 140 or your diastolic blood pressure is over 90.  You have kidney disease and your blood pressure is above 140/90.  You have heart disease and your blood pressure is above 140/90. Your personal target blood pressure may vary depending on your medical conditions, your age, and other factors. HOME CARE INSTRUCTIONS    Have your blood pressure rechecked as directed by your health care provider.   Take medicines only as directed by your health care provider. Follow the directions carefully. Blood pressure medicines must be taken as prescribed. The medicine does not work as well when you skip doses. Skipping doses also puts you at risk for  problems.  Do not smoke.   Monitor your blood pressure at home as directed by your health care provider. SEEK MEDICAL CARE IF:   You think you are having a reaction to medicines taken.  You have recurrent headaches or feel dizzy.  You have swelling in your ankles.  You have trouble with your vision. SEEK IMMEDIATE MEDICAL CARE IF:  You develop a severe headache or confusion.  You have unusual weakness, numbness, or feel faint.  You have severe chest or abdominal pain.  You vomit repeatedly.  You have trouble breathing. MAKE SURE YOU:   Understand these instructions.  Will watch your condition.  Will get help right away if you are not doing well or get worse.   This information is not intended to replace advice given to you by your health care provider. Make sure you discuss any questions you have with your health care provider.   Document Released: 07/05/2005 Document Revised: 11/19/2014 Document Reviewed: 04/27/2013 Elsevier Interactive Patient Education 2016 [employer]   Avoid taking any more ibuprofen and the alka seltzer cold medicine which may be driving your blood pressure higher.  I suggest Coricidin Brand products when needed for cold and congestion symptoms as these will not affect your blood pressure. Your lab tests tonight are ok.

## 2015-07-10 ENCOUNTER — Encounter (HOSPITAL_COMMUNITY): Payer: Self-pay | Admitting: *Deleted

## 2015-07-10 ENCOUNTER — Emergency Department (HOSPITAL_COMMUNITY)
Admission: EM | Admit: 2015-07-10 | Discharge: 2015-07-10 | Disposition: A | Discharge status: Home / Self Care | Payer: BLUE CROSS/BLUE SHIELD | Attending: Emergency Medicine | Admitting: Emergency Medicine

## 2015-07-10 DIAGNOSIS — Z794 Long term (current) use of insulin: Secondary | ICD-10-CM | POA: Insufficient documentation

## 2015-07-10 DIAGNOSIS — I10 Essential (primary) hypertension: Secondary | ICD-10-CM | POA: Insufficient documentation

## 2015-07-10 DIAGNOSIS — E7800 Pure hypercholesterolemia, unspecified: Secondary | ICD-10-CM | POA: Diagnosis not present

## 2015-07-10 DIAGNOSIS — Z880 Allergy status to penicillin: Secondary | ICD-10-CM | POA: Diagnosis not present

## 2015-07-10 DIAGNOSIS — Z7984 Long term (current) use of oral hypoglycemic drugs: Secondary | ICD-10-CM | POA: Insufficient documentation

## 2015-07-10 DIAGNOSIS — E119 Type 2 diabetes mellitus without complications: Secondary | ICD-10-CM | POA: Insufficient documentation

## 2015-07-10 DIAGNOSIS — Z79899 Other long term (current) drug therapy: Secondary | ICD-10-CM | POA: Insufficient documentation

## 2015-07-10 DIAGNOSIS — R0981 Nasal congestion: Secondary | ICD-10-CM | POA: Insufficient documentation

## 2015-07-10 DIAGNOSIS — R0989 Other specified symptoms and signs involving the circulatory and respiratory systems: Secondary | ICD-10-CM | POA: Insufficient documentation

## 2015-07-10 DIAGNOSIS — R0982 Postnasal drip: Secondary | ICD-10-CM | POA: Diagnosis not present

## 2015-07-10 DIAGNOSIS — R1310 Dysphagia, unspecified: Secondary | ICD-10-CM | POA: Insufficient documentation

## 2015-07-10 DIAGNOSIS — Z7982 Long term (current) use of aspirin: Secondary | ICD-10-CM | POA: Diagnosis not present

## 2015-07-10 NOTE — ED Notes (Addendum)
Pt states he got off work this morning and took his daily meds. States that he felt like a pill may have gotten lodged in his throat. States he was able to eat cereal and drink liquids afterwards. States he then had coffee and then vomited x 1. States he feels like he is panicking and feels like his throat may be closing up some. NAD. Pt is able to speak complete sentences with no distress. Pt states he is also getting over a recent cold and states dryness to throat.

## 2015-07-10 NOTE — Discharge Instructions (Signed)
Your vital signs are well within normal limits. Your oxygen level is 99%. Your airway is open at this time. Please return to the emergency department immediately if any difficulty with swallowing, difficulty with breathing, or changes in your general condition.

## 2015-07-10 NOTE — ED Provider Notes (Signed)
CSN: [id number]     Arrival date & time 07/10/15  9629 History   First MD Initiated Contact with Patient 07/10/15 1028     Chief Complaint  Patient presents with  . Foreign Body     (Consider location/radiation/quality/duration/timing/severity/associated sxs/prior Treatment) HPI Comments: Patient is a 54 year old male who presents to the emergency department with a sensation of foreign body in the throat.  The patient states that after having worked for an extended period of time he finished a night shift, came home this morning, took his daily medications. He felt as though that a pill was lodged in the back of his throat. He ate cereal and drank liquids, which he was able to get to go down without any major problem. He still felt the sensation, and drank some coffee to again see if he could get this foreign body sensation to go away. Not long after taking the coffee, he states he vomited 1. He then began to panic, and feel as though that his throat might be closing up. He went to his primary physician, and was told to come to the emergency department for additional evaluation. While sitting in the waiting area, he states that he felt as though the sensation was leaving, he felt that he couldn't breathe and swallow better without problem. He denies any swelling on any other parts of his body, no rash, no chest pain. It is of note that the patient is getting over a cold, and states he's had a lot of congestion and some irritation of his throat.  Patient is a 54 y.o. male presenting with foreign body. The history is provided by the patient.  Foreign Body Location:  Swallowed Associated symptoms: congestion and trouble swallowing   Associated symptoms: no choking     Past Medical History  Diagnosis Date  . Diabetes mellitus without complication (HCC)   . Hypertension   . High cholesterol    History reviewed. No pertinent past surgical history. No family history on file. Social History   Substance Use Topics  . Smoking status: Never Smoker   . Smokeless tobacco: None  . Alcohol Use: No    Review of Systems  HENT: Positive for congestion, postnasal drip and trouble swallowing.   Respiratory: Negative for choking, shortness of breath, wheezing and stridor.   Cardiovascular: Negative for chest pain.  All other systems reviewed and are negative.     Allergies  Penicillins  Home Medications   Prior to Admission medications   Medication Sig Start Date End Date Taking? Authorizing Provider  acetaminophen (TYLENOL) 500 MG tablet Take 1,000-1,500 mg by mouth 2 (two) times daily as needed for mild pain or moderate pain.     Historical Provider, MD  ALPRAZolam Prudy Feeler) 0.25 MG tablet Take 1 tablet by mouth 3 (three) times daily as needed for anxiety or sleep.  03/27/15   Historical Provider, MD  aspirin EC 81 MG tablet Take 81 mg by mouth daily.    Historical Provider, MD  calcium carbonate (TUMS - DOSED IN MG ELEMENTAL CALCIUM) 500 MG chewable tablet Chew 2 tablets by mouth 2 (two) times daily with a meal.    Historical Provider, MD  docusate sodium (COLACE) 100 MG capsule Take 100 mg by mouth daily as needed for mild constipation.    Historical Provider, MD  gabapentin (NEURONTIN) 300 MG capsule Take 300 mg by mouth daily as needed (for pain).     Historical Provider, MD  LANTUS SOLOSTAR 100 UNIT/ML Solostar Pen  Take 70 Units by mouth every morning. 05/22/15   Historical Provider, MD  lisinopril (PRINIVIL,ZESTRIL) 20 MG tablet Take 20 mg by mouth daily. 05/22/15   Historical Provider, MD  lovastatin (MEVACOR) 40 MG tablet Take 40 mg by mouth daily. 05/22/15   Historical Provider, MD  metFORMIN (GLUCOPHAGE-XR) 500 MG 24 hr tablet Take 500 mg by mouth 2 (two) times daily. 05/07/15   Historical Provider, MD  Probiotic Product (ACIDOPHILUS PEARLS) CAPS Take 1 capsule by mouth daily.    Historical Provider, MD  traMADol (ULTRAM) 50 MG tablet Take 50 mg by mouth every 6 (six) hours as  needed for moderate pain.    Historical Provider, MD   BP 163/91 mmHg  Pulse 115  Temp(Src) 98.1 F (36.7 C) (Oral)  Resp 18  Ht 5\' 8"  (1.727 m)  Wt 117.935 kg  BMI 39.54 kg/m2  SpO2 99% Physical Exam  Constitutional: He is oriented to person, place, and time. He appears well-developed and well-nourished.  Non-toxic appearance.  HENT:  Head: Normocephalic.  Right Ear: Tympanic membrane and external ear normal.  Left Ear: Tympanic membrane and external ear normal.  There is no edema of the tongue. The uvula is in the midline. There is minimal redness of the posterior pharynx. The airway is patent. Speech is clear.  Eyes: EOM and lids are normal. Pupils are equal, round, and reactive to light.  Neck: Normal range of motion. Neck supple. Carotid bruit is not present.  The trachea is in the midline. There is no palpable lymphadenopathy. Is no stridor appreciated.  Cardiovascular: Normal rate, regular rhythm, normal heart sounds, intact distal pulses and normal pulses.   Pulmonary/Chest: Breath sounds normal. No respiratory distress.  Patient speaks in complete sentences without problem. There is symmetrical rise and fall of the chest without problem.  Abdominal: Soft. Bowel sounds are normal. There is no tenderness. There is no guarding.  Musculoskeletal: Normal range of motion.  Lymphadenopathy:       Head (right side): No submandibular adenopathy present.       Head (left side): No submandibular adenopathy present.    He has no cervical adenopathy.  Neurological: He is alert and oriented to person, place, and time. He has normal strength. No cranial nerve deficit or sensory deficit.  Skin: Skin is warm and dry.  Psychiatric: He has a normal mood and affect. His speech is normal.  Nursing note and vitals reviewed.   ED Course  Procedures (including critical care time) Labs Review Labs Reviewed - No data to display  Imaging Review No results found. I have personally reviewed  and evaluated these images and lab results as part of my medical decision-making.   EKG Interpretation None      MDM  Vital signs reviewed. The pulse oximetry is 99% on room air. The patient speaks in complete sentences without problem. The patient states that he feels as though the foreign body sensation in his left throat has improved. He feels as though he can breathe and swallow without problem. The patient was reassured that his airway was patent, his oxygen level was well within normal limits. Discussed with the patient the need to return immediately if any changes, problems, or concerns with regard to his swallowing, breathing, or his throat. The patient is in agreement with the discharge plan.    Final diagnoses:  None    **I have reviewed nursing notes, vital signs, and all appropriate lab and imaging results for this patient.*  Ivery Quale, PA-C 07/10/15 1125  Loren Racer, MD 07/12/15 [phone]

## 2015-09-18 ENCOUNTER — Ambulatory Visit (HOSPITAL_COMMUNITY)
Admission: RE | Admit: 2015-09-18 | Discharge: 2015-09-18 | Disposition: A | Discharge status: Home / Self Care | Payer: BLUE CROSS/BLUE SHIELD | Source: Ambulatory Visit | Attending: Internal Medicine | Admitting: Internal Medicine

## 2015-09-18 ENCOUNTER — Other Ambulatory Visit (HOSPITAL_COMMUNITY): Payer: Self-pay | Admitting: Internal Medicine

## 2015-09-18 DIAGNOSIS — R05 Cough: Secondary | ICD-10-CM

## 2015-09-18 DIAGNOSIS — R059 Cough, unspecified: Secondary | ICD-10-CM

## 2015-09-18 DIAGNOSIS — Z1389 Encounter for screening for other disorder: Secondary | ICD-10-CM | POA: Insufficient documentation

## 2017-08-09 IMAGING — DX DG CHEST 2V
2 series · 2 of 2 positions shown · non-contrast
Comparison: None.

CLINICAL DATA: Left-sided chest pain for 3 weeks.

EXAM:
CHEST  2 VIEW

[chest pa]
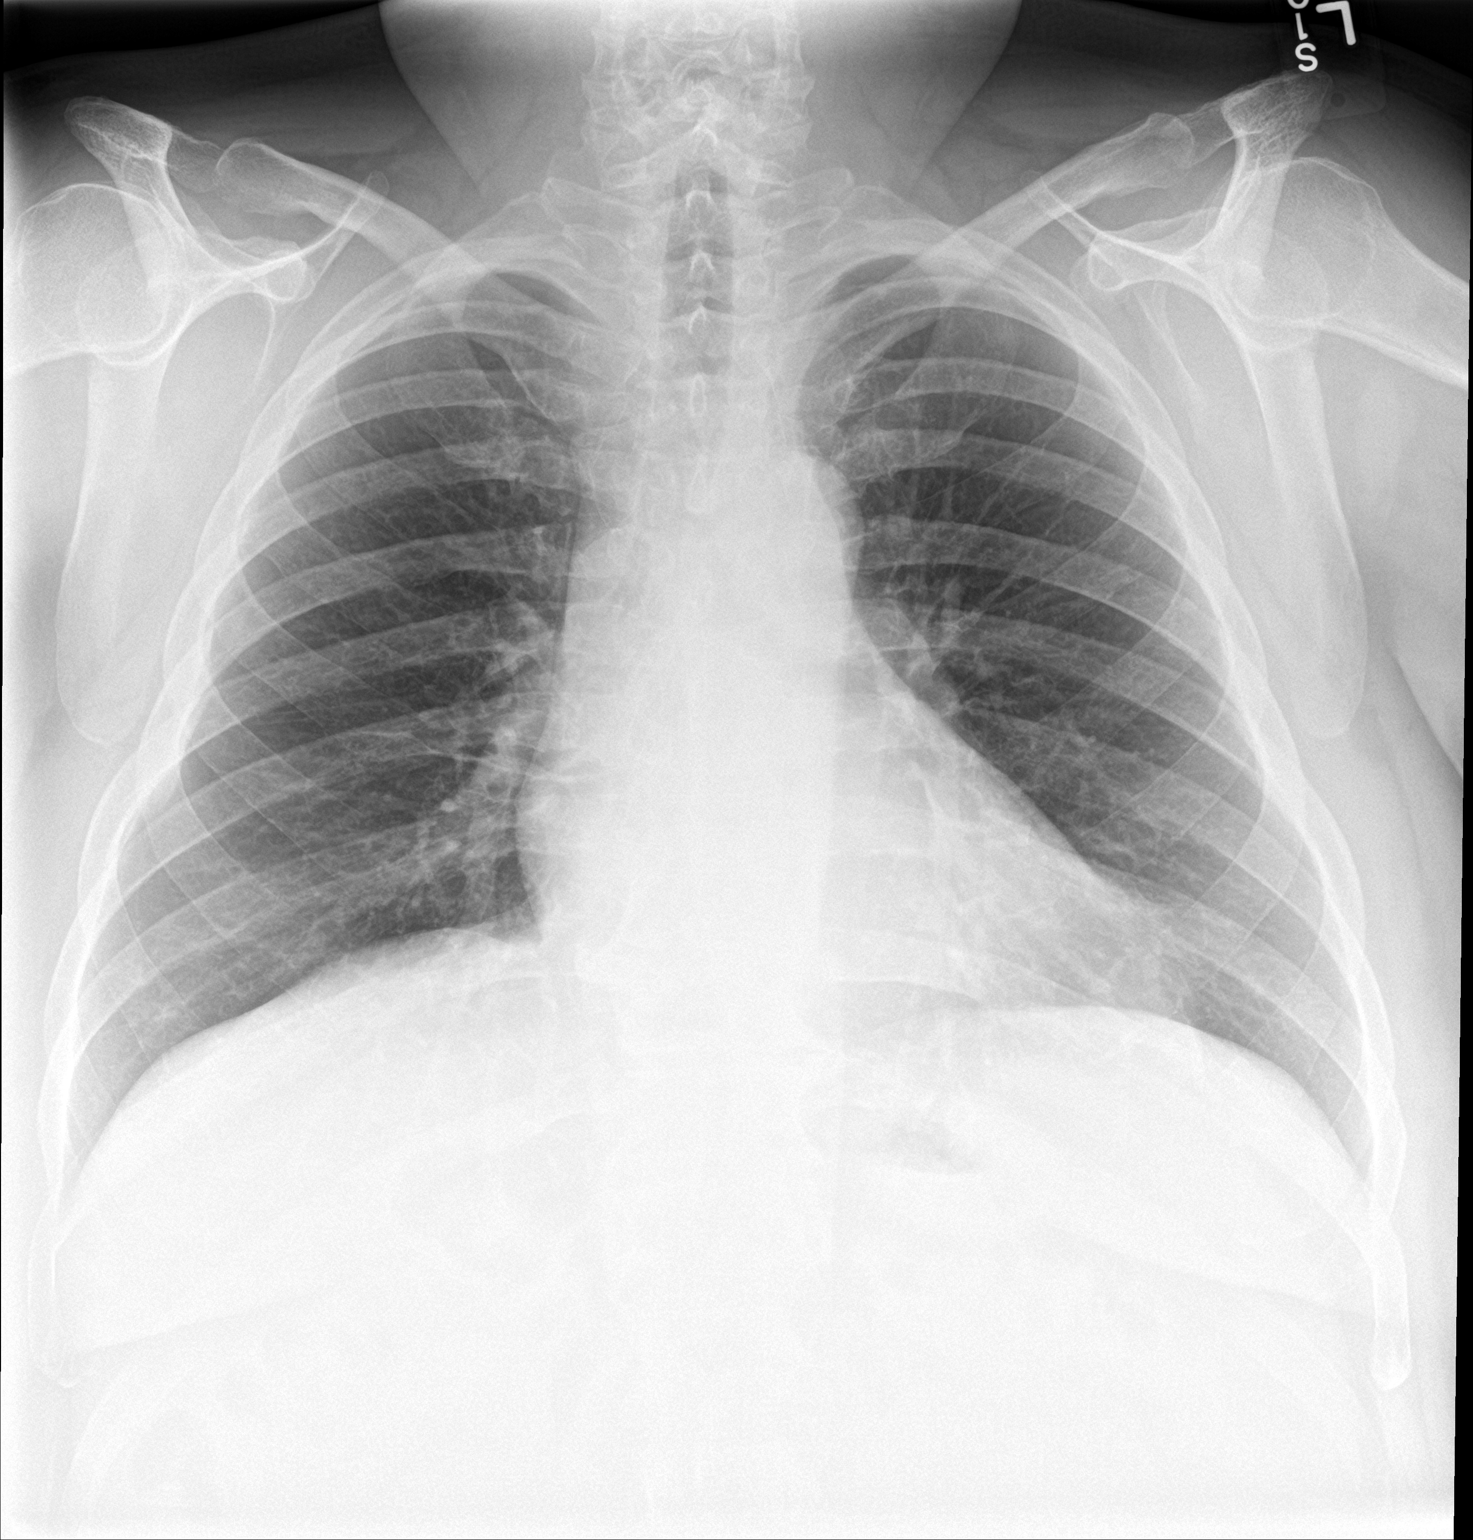

[chest lat]
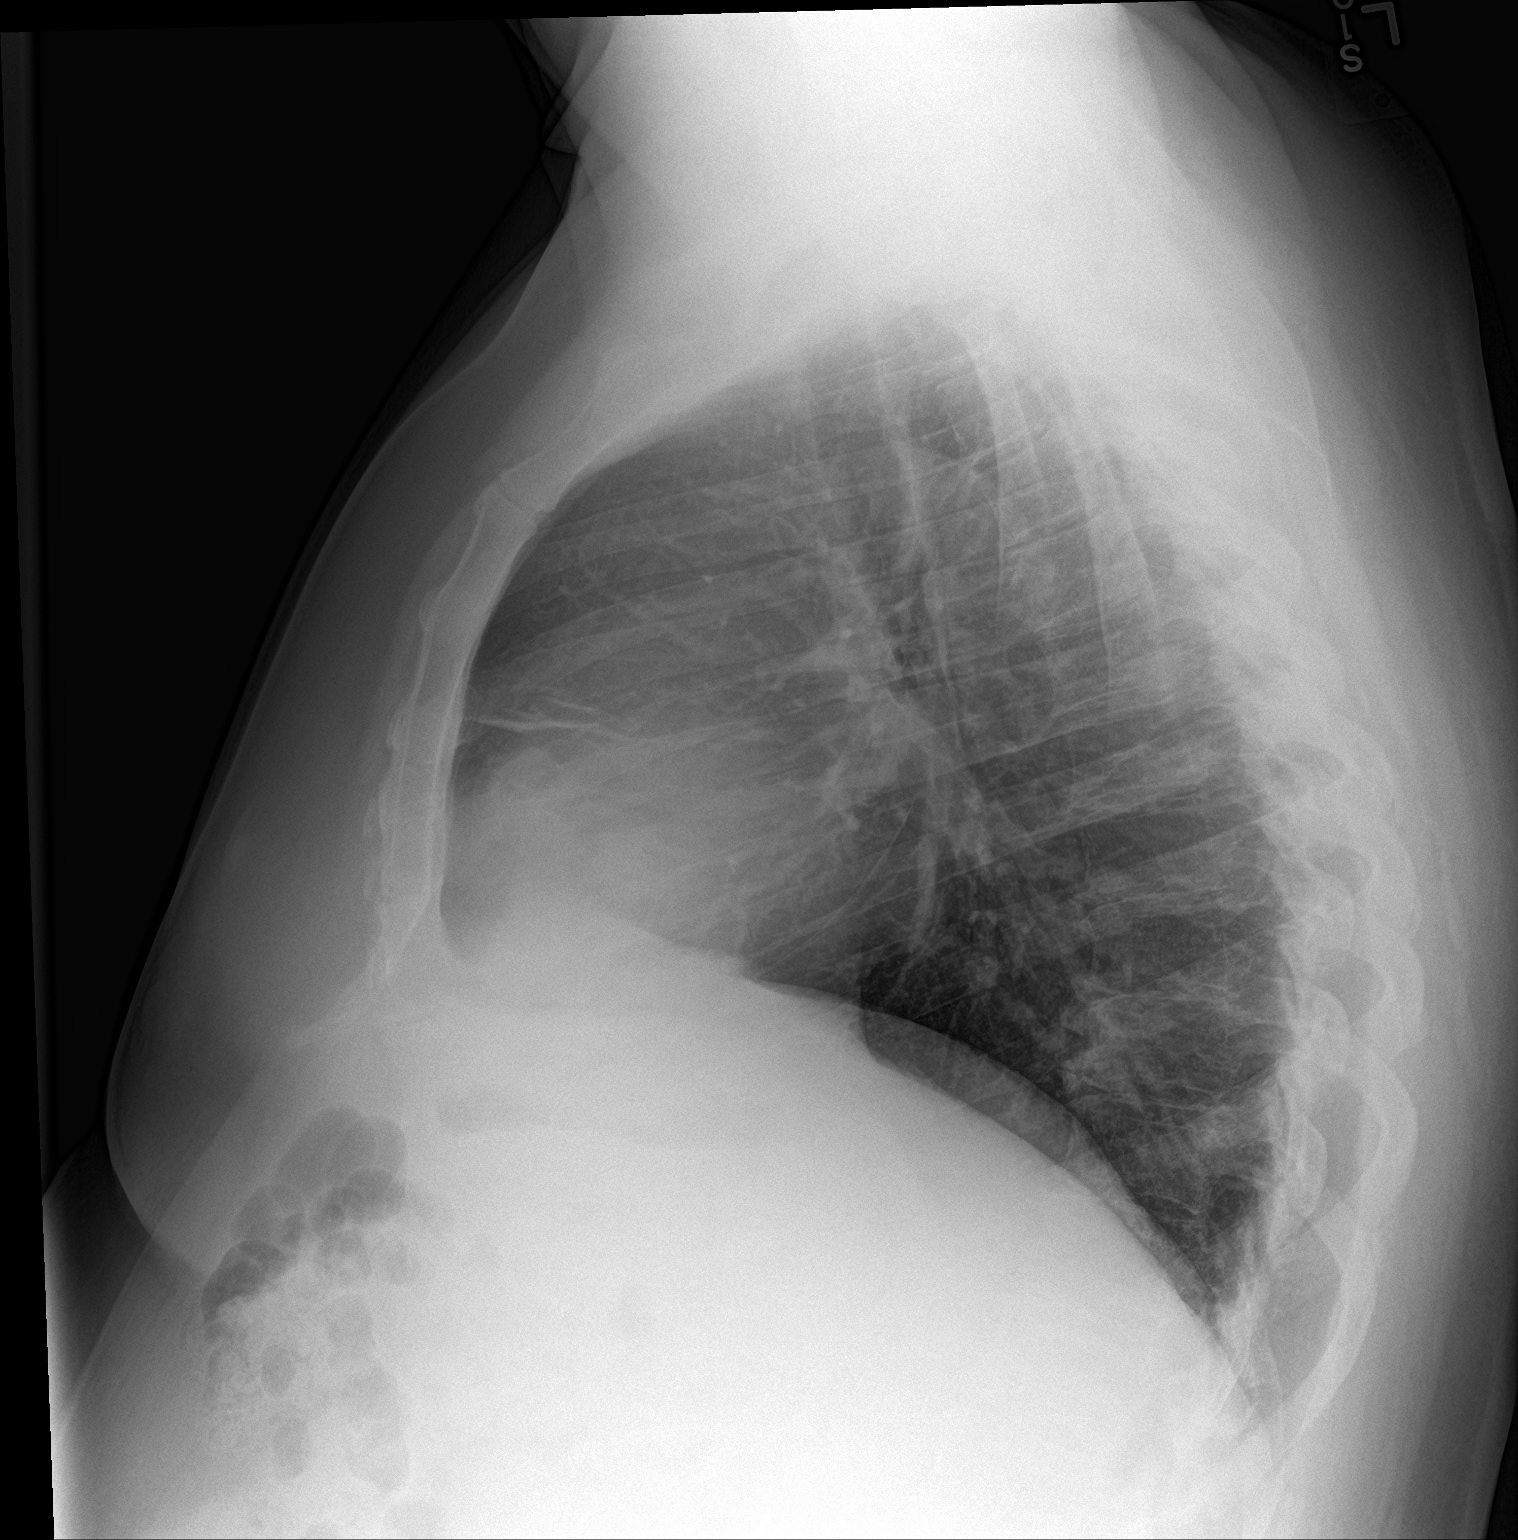

[2 of 2 positions shown; findings below may reference images not displayed]

FINDINGS: The heart size and mediastinal contours are within normal limits.
Both lungs are clear. No pneumothorax or pleural effusion is noted.
The visualized skeletal structures are unremarkable.
IMPRESSION: No active cardiopulmonary disease.

## 2021-06-03 ENCOUNTER — Encounter (INDEPENDENT_AMBULATORY_CARE_PROVIDER_SITE_OTHER): Payer: Self-pay | Admitting: *Deleted

## 2021-08-29 ENCOUNTER — Other Ambulatory Visit: Payer: Self-pay

## 2021-08-29 ENCOUNTER — Ambulatory Visit
Admission: EM | Admit: 2021-08-29 | Discharge: 2021-08-29 | Disposition: A | Discharge status: Home / Self Care | Payer: Commercial Managed Care - PPO

## 2021-08-29 ENCOUNTER — Ambulatory Visit: Payer: Self-pay

## 2021-08-29 DIAGNOSIS — R300 Dysuria: Secondary | ICD-10-CM | POA: Diagnosis not present

## 2021-08-29 LAB — POCT URINALYSIS DIP (MANUAL ENTRY)
Bilirubin, UA: NEGATIVE
Blood, UA: NEGATIVE
Glucose, UA: NEGATIVE mg/dL
Ketones, POC UA: NEGATIVE mg/dL
Leukocytes, UA: NEGATIVE
Nitrite, UA: NEGATIVE
Protein Ur, POC: NEGATIVE mg/dL
Spec Grav, UA: 1.02 (ref 1.010–1.025)
Urobilinogen, UA: 0.2 E.U./dL
pH, UA: 5.5 (ref 5.0–8.0)

## 2021-08-29 MED ORDER — CIPROFLOXACIN HCL 500 MG PO TABS: 500.0000 mg | ORAL_TABLET | Freq: Two times a day (BID) | ORAL | 0 refills | Status: DC

## 2021-08-29 NOTE — ED Provider Notes (Signed)
[facility]    CSN: [id number] Arrival date & time: 08/29/21  1143      History   Chief Complaint Chief Complaint  Patient presents with   UTI symptoms    HPI Andre White is a 61 y.o. male.   Presenting today with 3-day history of dysuria, what he believes to be prostate tenderness that is worse when he sits down.  He denies fever, chills, abdominal pain, nausea vomiting, hematuria, penile discharge or rashes, concern for STIs.  Has been trying to drink more water with minimal relief.  Has had a prostate infection in the past that felt very similar to this.  Known history of enlarged prostate.   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 No pertinent surgical history.     Home Medications    Prior to Admission medications   Medication Sig Start Date End Date Taking? Authorizing Provider  ciprofloxacin (CIPRO) 500 MG tablet Take 1 tablet (500 mg total) by mouth 2 (two) times daily. 08/29/21  Yes Volney American, PA-C  acetaminophen (TYLENOL) 500 MG tablet Take 1,000-1,500 mg by mouth 2 (two) times daily as needed for mild pain or moderate pain.     [provider]  ALPRAZolam Duanne Moron) 0.25 MG tablet Take 1 tablet by mouth 3 (three) times daily as needed for anxiety or sleep.  03/27/15   [provider]  aspirin EC 81 MG tablet Take 81 mg by mouth daily.    [provider]  calcium carbonate (TUMS - DOSED IN MG ELEMENTAL CALCIUM) 500 MG chewable tablet Chew 2 tablets by mouth 2 (two) times daily with a meal.    [provider]  diltiazem (TIAZAC) 360 MG 24 hr capsule Take 360 mg by mouth at bedtime. 07/02/21   [provider]  docusate sodium (COLACE) 100 MG capsule Take 100 mg by mouth daily as needed for mild  constipation.    [provider]  gabapentin (NEURONTIN) 300 MG capsule Take 300 mg by mouth daily as needed (for pain).     [provider]  LANTUS SOLOSTAR 100 UNIT/ML Solostar Pen Take 70 Units by mouth every morning. 05/22/15   [provider]  lisinopril (PRINIVIL,ZESTRIL) 20 MG tablet Take 20 mg by mouth daily. 05/22/15   [provider]  lovastatin (MEVACOR) 40 MG tablet Take 40 mg by mouth daily. 05/22/15   [provider]  metFORMIN (GLUCOPHAGE-XR) 500 MG 24 hr tablet Take 500 mg by mouth 2 (two) times daily. 05/07/15   [provider]  olmesartan (BENICAR) 40 MG tablet Take 40 mg by mouth daily. 07/03/21   [provider]  Probiotic Product (ACIDOPHILUS PEARLS) CAPS Take 1 capsule by mouth daily.    [provider]  SEMGLEE, YFGN, 100 UNIT/ML Pen SMARTSIG:0-100 Unit(s) SUB-Q Daily 06/18/21   [provider]  traMADol (ULTRAM) 50 MG tablet Take 50 mg by mouth every 6 (six) hours as needed for moderate pain.    [provider]    Family History Family History  Problem Relation Age of Onset   Emphysema Mother    Aneurysm Father     Social History Social History   Tobacco Use   Smoking status: Never  Vaping Use   Vaping Use:  Never used  Substance Use Topics   Alcohol use: No   Drug use: No     Allergies   Penicillins   Review of Systems Review of Systems Per HPI  Physical Exam Triage Vital Signs ED Triage Vitals  Enc Vitals Group     BP 08/29/21 1232 (!) 183/85     Pulse Rate 08/29/21 1232 94     Resp 08/29/21 1232 18     Temp 08/29/21 1232 97.9 F (36.6 C)     Temp Source 08/29/21 1232 Oral     SpO2 08/29/21 1232 97 %     Weight --      Height --      Head Circumference --      Peak Flow --      Pain Score 08/29/21 1226 3     Pain Loc --      Pain Edu? --      Excl. in [place]? --    No data found.  Updated Vital Signs BP (!) 183/85 (BP Location: Right Arm)    Pulse  94    Temp 97.9 F (36.6 C) (Oral)    Resp 18    SpO2 97%   Visual Acuity Right Eye Distance:   Left Eye Distance:   Bilateral Distance:    Right Eye Near:   Left Eye Near:    Bilateral Near:     Physical Exam Vitals and nursing note reviewed.  Constitutional:      Appearance: Normal appearance.  HENT:     Head: Atraumatic.  Eyes:     Extraocular Movements: Extraocular movements intact.     Conjunctiva/sclera: Conjunctivae normal.  Cardiovascular:     Rate and Rhythm: Normal rate and regular rhythm.  Pulmonary:     Effort: Pulmonary effort is normal.     Breath sounds: Normal breath sounds.  Abdominal:     General: Bowel sounds are normal. There is no distension.     Palpations: Abdomen is soft.     Tenderness: There is no abdominal tenderness. There is no guarding.  Genitourinary:    Comments: GU exam deferred Musculoskeletal:        General: Normal range of motion.     Cervical back: Normal range of motion and neck supple.  Skin:    General: Skin is warm and dry.  Neurological:     General: No focal deficit present.     Mental Status: He is oriented to person, place, and time.  Psychiatric:        Mood and Affect: Mood normal.        Thought Content: Thought content normal.        Judgment: Judgment normal.     UC Treatments / Results  Labs (all labs ordered are listed, but only abnormal results are displayed) Labs Reviewed  POCT URINALYSIS DIP (MANUAL ENTRY)    EKG   Radiology No results found.  Procedures Procedures (including critical care time)  Medications Ordered in UC Medications - No data to display  Initial Impression / Assessment and Plan / UC Course  I have reviewed the triage vital signs and the nursing notes.  Pertinent labs & imaging results that were available during my care of the patient were reviewed by me and considered in my medical decision making (see chart for details).     Hypertensive in triage, discussed to recheck  at home over the next few days and follow-up with PCP on this if it still  elevated.  He denies any chest pain, shortness of breath or other symptoms of concern today with that.  Suspect a prostate infection causing his symptoms.  UA negative for urinary tract infection but given his urinary symptoms and prostate tenderness reported will start prolonged course of Cipro, fluids, over-the-counter pain relievers.  Close PCP follow-up recommended.  Return for acutely worsening symptoms.  Final Clinical Impressions(s) / UC Diagnoses   Final diagnoses:  Dysuria   Discharge Instructions   None    ED Prescriptions     Medication Sig Dispense Auth. Provider   ciprofloxacin (CIPRO) 500 MG tablet Take 1 tablet (500 mg total) by mouth 2 (two) times daily. 28 tablet Volney American, [place]      PDMP not reviewed this encounter.   Volney American, [place] 08/29/21 1256

## 2021-08-29 NOTE — ED Triage Notes (Signed)
Patient states he is having urgency to urinated and there is some pressure and burning in his penis since Thursday  Patient states he took Tylenol for the pain  Patient states he may have an enlarged prostate  Denies Fever

## 2021-08-30 ENCOUNTER — Telehealth: Payer: Self-pay | Admitting: Emergency Medicine

## 2021-08-30 NOTE — Telephone Encounter (Signed)
Pt reported pharmacist told pt to call and inquire if safe to start cipro px as had "reaction" to levaquin several years ago. Pt described reaction as "everything white,like my sugar was about to drop." Pt believes this feeling to be coincidental as "sugar may have dropped." Consulted PA and gave verbal order for pt to start cipro px. Pt verbalized understanding of care plan.

## 2021-11-16 ENCOUNTER — Encounter (INDEPENDENT_AMBULATORY_CARE_PROVIDER_SITE_OTHER): Payer: Self-pay | Admitting: *Deleted

## 2023-01-26 ENCOUNTER — Ambulatory Visit
Admission: RE | Admit: 2023-01-26 | Discharge: 2023-01-26 | Disposition: A | Discharge status: Home / Self Care | Payer: Commercial Managed Care - PPO | Source: Ambulatory Visit | Attending: Nurse Practitioner | Admitting: Nurse Practitioner

## 2023-01-26 ENCOUNTER — Other Ambulatory Visit: Payer: Self-pay

## 2023-01-26 VITALS — BP 167/86 | HR 87 | Temp 98.5°F | Resp 20

## 2023-01-26 DIAGNOSIS — N50812 Left testicular pain: Secondary | ICD-10-CM | POA: Diagnosis not present

## 2023-01-26 DIAGNOSIS — R3915 Urgency of urination: Secondary | ICD-10-CM | POA: Diagnosis not present

## 2023-01-26 DIAGNOSIS — N400 Enlarged prostate without lower urinary tract symptoms: Secondary | ICD-10-CM

## 2023-01-26 LAB — POCT URINALYSIS DIP (MANUAL ENTRY)
Bilirubin, UA: NEGATIVE
Glucose, UA: NEGATIVE mg/dL
Ketones, POC UA: NEGATIVE mg/dL
Leukocytes, UA: NEGATIVE
Nitrite, UA: NEGATIVE
Protein Ur, POC: NEGATIVE mg/dL
Spec Grav, UA: 1.025 (ref 1.010–1.025)
Urobilinogen, UA: 0.2 E.U./dL
pH, UA: 6 (ref 5.0–8.0)

## 2023-01-26 MED ORDER — CIPROFLOXACIN HCL 500 MG PO TABS: 500.0000 mg | ORAL_TABLET | Freq: Two times a day (BID) | ORAL | 0 refills | Status: AC

## 2023-01-26 NOTE — ED Triage Notes (Signed)
Pt reports bilateral groin pain and urinary urgency and testicle swelling. Reports history of similar with prostate infection.

## 2023-01-26 NOTE — ED Provider Notes (Signed)
[facility]    CSN: [id number] Arrival date & time: 01/26/23  1815      History   Chief Complaint Chief Complaint  Patient presents with   Groin Pain    Entered by patient    HPI Andre White is a 62 y.o. male.   The history is provided by the patient.   The patient presents for complaints of left-sided inguinal pain and urinary urgency.  Patient's symptoms started over the past 1 to 2 days.  Patient states he has felt his symptoms progressively worsening. He denies fever, chills, abdominal pain, nausea vomiting, hematuria, penile discharge or rashes, concern for STIs. Has been trying to drink more water with minimal relief. Has had a prostate infection in the past that felt very similar to this. Known history of enlarged prostate.  He was treated for the same or similar symptoms approximately 1 year ago. 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 No pertinent surgical history.     Home Medications    Prior to Admission medications   Medication Sig Start Date End Date Taking? Authorizing Provider  ciprofloxacin (CIPRO) 500 MG tablet Take 1 tablet (500 mg total) by mouth 2 (two) times daily. 01/26/23  Yes Misao Fackrell-Warren, Sadie Haber, NP  acetaminophen (TYLENOL) 500 MG tablet Take 1,000-1,500 mg by mouth 2 (two) times daily as needed for mild pain or moderate pain.     [provider]  ALPRAZolam Prudy Feeler) 0.25 MG tablet Take 1 tablet by mouth 3 (three) times daily as needed for anxiety or sleep.  03/27/15   [provider]  aspirin EC 81 MG tablet Take 81 mg by mouth daily.    [provider]  calcium carbonate (TUMS - DOSED IN MG ELEMENTAL CALCIUM) 500 MG chewable tablet Chew 2 tablets by mouth 2 (two) times daily with a meal.    [provider]  diltiazem (TIAZAC) 360 MG 24 hr capsule Take 360 mg by mouth at bedtime. 07/02/21   [provider]  docusate sodium (COLACE) 100 MG capsule Take 100 mg by mouth daily as needed for mild constipation.    [provider]  gabapentin (NEURONTIN) 300 MG capsule Take 300 mg by mouth daily as needed (for pain).     [provider]  LANTUS SOLOSTAR 100 UNIT/ML Solostar Pen Take 70 Units by mouth every morning. 05/22/15   [provider]  lisinopril (PRINIVIL,ZESTRIL) 20 MG tablet Take 20 mg by mouth daily. 05/22/15   [provider]  lovastatin (MEVACOR) 40 MG tablet Take 40 mg by mouth daily. 05/22/15   [provider]  metFORMIN (GLUCOPHAGE-XR) 500 MG 24 hr tablet Take 500 mg by mouth 2 (two) times daily. 05/07/15   [provider]  olmesartan (BENICAR) 40 MG tablet Take 40 mg by mouth daily. 07/03/21   [provider]  Probiotic Product (ACIDOPHILUS PEARLS) CAPS Take 1 capsule by mouth daily.    [provider]  SEMGLEE, YFGN, 100 UNIT/ML Pen SMARTSIG:0-100 Unit(s) SUB-Q Daily 06/18/21   [provider]  traMADol (ULTRAM) 50 MG tablet Take 50 mg by mouth every 6 (six) hours as needed for moderate pain.    [provider]    Family History Family History  Problem Relation Age  of Onset   Emphysema Mother    Aneurysm Father     Social History Social History   Tobacco Use   Smoking status: Never  Vaping Use   Vaping Use: Never used  Substance Use Topics   Alcohol use: No   Drug use: No     Allergies   Penicillins   Review of Systems Review of Systems Per HPI  Physical Exam Triage Vital Signs ED Triage Vitals  Enc Vitals Group     BP 01/26/23 1903 (!) 167/86     Pulse Rate 01/26/23 1903 87     Resp 01/26/23 1903 20     Temp 01/26/23 1903 98.5 F (36.9 C)     Temp Source 01/26/23 1903 Oral     SpO2 01/26/23 1903 95 %     Weight --      Height --      Head  Circumference --      Peak Flow --      Pain Score 01/26/23 1859 4     Pain Loc --      Pain Edu? --      Excl. in GC? --    No data found.  Updated Vital Signs BP (!) 167/86 (BP Location: Right Arm)   Pulse 87   Temp 98.5 F (36.9 C) (Oral)   Resp 20   SpO2 95%   Visual Acuity Right Eye Distance:   Left Eye Distance:   Bilateral Distance:    Right Eye Near:   Left Eye Near:    Bilateral Near:     Physical Exam Vitals and nursing note reviewed. Exam conducted with a chaperone present Victorino Dike, Charity fundraiser).  Constitutional:      General: He is not in acute distress.    Appearance: Normal appearance.  HENT:     Head: Normocephalic.  Eyes:     Extraocular Movements: Extraocular movements intact.     Pupils: Pupils are equal, round, and reactive to light.  Cardiovascular:     Rate and Rhythm: Normal rate and regular rhythm.     Pulses: Normal pulses.     Heart sounds: Normal heart sounds.  Pulmonary:     Effort: Pulmonary effort is normal. No respiratory distress.     Breath sounds: Normal breath sounds. No stridor. No wheezing, rhonchi or rales.  Abdominal:     General: Bowel sounds are normal.     Palpations: Abdomen is soft.     Tenderness: There is no abdominal tenderness. There is no right CVA tenderness or left CVA tenderness.     Hernia: No hernia is present. There is no hernia in the left inguinal area.  Genitourinary:    Pubic Area: No rash.      Penis: Normal.      Testes:        Left: Tenderness present. Swelling not present.     Epididymis:     Left: Not inflamed or enlarged.  Musculoskeletal:     Cervical back: Normal range of motion.  Skin:    General: Skin is warm and dry.  Neurological:     General: No focal deficit present.     Mental Status: He is alert and oriented to person, place, and time.  Psychiatric:        Mood and Affect: Mood normal.        Behavior: Behavior normal.     UC Treatments / Results  Labs (all labs ordered are listed,  but only abnormal  results are displayed) Labs Reviewed  POCT URINALYSIS DIP (MANUAL ENTRY) - Abnormal; Notable for the following components:      Result Value   Blood, UA trace-intact (*)    All other components within normal limits  URINE CULTURE    EKG   Radiology No results found.  Procedures Procedures (including critical care time)  Medications Ordered in UC Medications - No data to display  Initial Impression / Assessment and Plan / UC Course  I have reviewed the triage vital signs and the nursing notes.  Pertinent labs & imaging results that were available during my care of the patient were reviewed by me and considered in my medical decision making (see chart for details).  The patient is well-appearing, he is in no acute distress, vital signs are stable.  Urinalysis is negative for obvious urinary tract infection; however, patient presented approximately 1 year ago with the same or similar symptoms.  He was started on Cipro, reports that his symptoms improved.  Today, patient presents with left testicular tenderness and urinary urgency.  Will treat with short course of Cipro 500 mg twice daily.  Urine culture is pending.  If culture is negative, symptoms appear to be consistent with a left groin strain.  Patient instructed to apply ice or heat to the affected area, with gentle stretching exercises.  Patient advised to continue to increase fluids, over-the-counter Tylenol for pain or discomfort.  Patient advised if the culture is negative, he will be advised to stop the antibiotic.  Patient advised to follow-up with his PCP within the next 5 to 7 days for reevaluation.  Patient is in agreement with this plan of care and verbalizes understanding.  All questions were answered.  Patient stable for discharge.  Final Clinical Impressions(s) / UC Diagnoses   Final diagnoses:  Urinary urgency  Pain in left testicle     Discharge Instructions      Urine culture is pending.   If the results of the culture are negative, you will be contacted and advised to stop the antibiotic. Make sure you are drinking at least 8-10 8 ounce glasses of water daily while symptoms persist. May take over-the-counter Tylenol for pain or discomfort. Please follow-up with your primary care physician within the next 5 to 7 days for reevaluation. Follow-up as needed.     ED Prescriptions     Medication Sig Dispense Auth. Provider   ciprofloxacin (CIPRO) 500 MG tablet Take 1 tablet (500 mg total) by mouth 2 (two) times daily. 14 tablet Aleanna Menge-Warren, Sadie Haber, NP      PDMP not reviewed this encounter.   Abran Cantor, NP 01/27/23 1106

## 2023-01-26 NOTE — Discharge Instructions (Addendum)
Urine culture is pending.  If the results of the culture are negative, you will be contacted and advised to stop the antibiotic. Make sure you are drinking at least 8-10 8 ounce glasses of water daily while symptoms persist. May take over-the-counter Tylenol for pain or discomfort. Please follow-up with your primary care physician within the next 5 to 7 days for reevaluation. Follow-up as needed.

## 2023-01-28 ENCOUNTER — Telehealth: Payer: Self-pay

## 2023-01-28 NOTE — Telephone Encounter (Signed)
Pt called with c/o not tolerating medication for UTI.   First attempt Unable to reach pt , unable to LVM.

## 2023-01-28 NOTE — Telephone Encounter (Signed)
Pt came into clinic shortly after my first attempt to reach him. I informed him that his culture was not sent the day it was collected day he declined the offer to give another sample since the POC testing was normal. He did however state that now he has pressure underneath his scrotum and urinary retention which leads him to believe it may be more of his prostate that urinary related. I have advised to stop medication that was prescribed that day and for pt to follow up with his primary care to be further evaluated. Pt verbalized understanding

## 2024-02-06 ENCOUNTER — Other Ambulatory Visit (HOSPITAL_COMMUNITY): Payer: Self-pay
